# Patient Record
Sex: Male | Born: 1975 | Hispanic: No | Marital: Single | Smoking: Never smoker
Health system: Southern US, Community
[De-identification: ages and names within clinical notes are randomized; demographics above are authoritative.]

## PROBLEM LIST (undated history)

## (undated) ENCOUNTER — Emergency Department (HOSPITAL_COMMUNITY): Admission: EM | Payer: Self-pay | Source: Home / Self Care

---

## 2018-07-23 ENCOUNTER — Encounter (HOSPITAL_COMMUNITY): Payer: Self-pay | Admitting: Emergency Medicine

## 2018-07-23 ENCOUNTER — Ambulatory Visit (HOSPITAL_COMMUNITY)
Admission: EM | Admit: 2018-07-23 | Discharge: 2018-07-23 | Disposition: A | Discharge status: Home / Self Care | Payer: Self-pay | Attending: Family Medicine | Admitting: Family Medicine

## 2018-07-23 DIAGNOSIS — S39012A Strain of muscle, fascia and tendon of lower back, initial encounter: Secondary | ICD-10-CM

## 2018-07-23 MED ORDER — TIZANIDINE HCL 4 MG PO TABS: 4.0000 mg | ORAL_TABLET | Freq: Four times a day (QID) | ORAL | 0 refills | Status: AC | PRN

## 2018-07-23 NOTE — Discharge Instructions (Addendum)
You may take ibuprofen as needed for pain Take the tizanidine as needed muscle relaxer Ice or heat to back  Avoid heavy lifting and twisting for a couple of days  Chiropractor: [facility] [phone]
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
## 2019-01-18 ENCOUNTER — Emergency Department (HOSPITAL_COMMUNITY)
Admission: EM | Admit: 2019-01-18 | Discharge: 2019-01-18 | Disposition: A | Discharge status: Home / Self Care | Payer: Self-pay | Attending: Emergency Medicine | Admitting: Emergency Medicine

## 2019-01-18 ENCOUNTER — Other Ambulatory Visit: Payer: Self-pay

## 2019-01-18 ENCOUNTER — Emergency Department (HOSPITAL_COMMUNITY): Payer: Self-pay

## 2019-01-18 ENCOUNTER — Encounter (HOSPITAL_COMMUNITY): Payer: Self-pay

## 2019-01-18 DIAGNOSIS — S90212A Contusion of left great toe with damage to nail, initial encounter: Secondary | ICD-10-CM | POA: Insufficient documentation

## 2019-01-18 DIAGNOSIS — S92422A Displaced fracture of distal phalanx of left great toe, initial encounter for closed fracture: Secondary | ICD-10-CM | POA: Insufficient documentation

## 2019-01-18 DIAGNOSIS — Y939 Activity, unspecified: Secondary | ICD-10-CM | POA: Insufficient documentation

## 2019-01-18 DIAGNOSIS — S90422A Blister (nonthermal), left great toe, initial encounter: Secondary | ICD-10-CM | POA: Insufficient documentation

## 2019-01-18 DIAGNOSIS — W208XXA Other cause of strike by thrown, projected or falling object, initial encounter: Secondary | ICD-10-CM | POA: Insufficient documentation

## 2019-01-18 DIAGNOSIS — Y929 Unspecified place or not applicable: Secondary | ICD-10-CM | POA: Insufficient documentation

## 2019-01-18 DIAGNOSIS — Y999 Unspecified external cause status: Secondary | ICD-10-CM | POA: Insufficient documentation

## 2019-01-18 MED ORDER — ACETAMINOPHEN 500 MG PO TABS
1000.0000 mg | ORAL_TABLET | Freq: Once | ORAL | Status: DC
  Filled 2019-01-18: qty 2

## 2019-01-18 MED ORDER — AMOXICILLIN-POT CLAVULANATE 875-125 MG PO TABS: 1.0000 | ORAL_TABLET | Freq: Two times a day (BID) | ORAL | 0 refills | Status: AC

## 2019-01-18 MED ORDER — AMOXICILLIN-POT CLAVULANATE 875-125 MG PO TABS
1.0000 | ORAL_TABLET | Freq: Once | ORAL | Status: DC
  Filled 2019-01-18: qty 1

## 2019-01-18 MED ORDER — TETANUS-DIPHTH-ACELL PERTUSSIS 5-2.5-18.5 LF-MCG/0.5 IM SUSP
0.5000 mL | Freq: Once | INTRAMUSCULAR | Status: DC
  Filled 2019-01-18: qty 0.5

## 2019-01-18 NOTE — Discharge Instructions (Addendum)
Please see the information and instructions below regarding your visit.  Your diagnoses today include:  1. Closed fracture of distal phalanx of left great toe, physeal involvement unspecified, initial encounter    You have a fracture of the bone at the end of the toe.  It is beneath the toenail.  Tests performed today include: See side panel of your discharge paperwork for testing performed today. Vital signs are listed at the bottom of these instructions.   Medications prescribed:    Take any prescribed medications only as prescribed, and any over the counter medications only as directed on the packaging.  You may takeTylenol, 650 mg every 6 hours, so you are receiving something for pain every 3 hours.  This is not a long-term medication unless under the care and direction of your primary provider. Taking this medication long-term and not under the supervision of a healthcare provider could increase the risk of stomach ulcers, kidney problems, and cardiovascular problems such as high blood pressure.   You can alternate with ibuprofen, 600 mg every 6-8 hours as needed for pain.  Home care instructions:  Please follow any educational materials contained in this packet.   Please ice the area 3-4 times a day, 10 minutes on 10 minutes off repeated 3 times.  Use a barrier in between your toe and the ice to prevent any superficial nerve damage.  Please perform warm water soaks mixed wit hydrogen peroxide in a 50/50 mixture.  This will help dissolve some of the dried blood from underneath the toenail.  Ideally you should use the postop shoe that I gave you for protection and support of the toe for the next 6 weeks or until discussed with Dr. Lucia Gaskins.  The risks of discontinuing this prematurely are ongoing pain and development of osteoarthritis.  Follow-up instructions: Please follow-up with Dr. Lucia Gaskins of orthopedics as soon as possible.    Return instructions:  Please return to the Emergency  Department if you experience worsening symptoms.  Please come back to the ER if you have any worsening pain, swelling, redness, or drainage that is green or yellow. Please return if you have any other emergent concerns.  Additional Information:   Your vital signs today were: BP 133/88 (BP Location: Right Arm)    Pulse 85    Temp 98 F (36.7 C) (Oral)    Resp 20    SpO2 97%  If your blood pressure (BP) was elevated on multiple readings during this visit above 130 for the top number or above 80 for the bottom number, please have this repeated by your primary care provider within one month. --------------  Thank you for allowing [place] to participate in your care today.

## 2019-01-18 NOTE — Progress Notes (Signed)
Orthopedic Tech Progress Note Patient Details:  Danny Arroyo 17-Oct-1975 [id number]  Ortho Devices Type of Ortho Device: Postop shoe/boot Ortho Device/Splint Location: lle Ortho Device/Splint Interventions: Adjustment, Application, Ordered   Post Interventions Patient Tolerated: Well Instructions Provided: Poper ambulation with device, Care of device, Adjustment of device   Janit Pagan 01/18/2019, 9:13 AM
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
## 2019-01-18 NOTE — ED Notes (Signed)
ED Provider at bedside. 

## 2019-01-18 NOTE — ED Notes (Signed)
Ortho tech at bedside 

## 2019-01-18 NOTE — ED Provider Notes (Signed)
[facility] DEPART[facility] Provider Note   CSN: [id number] Arrival date & time: 01/18/19  0700     History   Chief Complaint Chief Complaint  Patient presents with  . Foot Injury    HPI [facility] Arroyo is a 43 y.o. male.     HPI   Patient is a 43 year old male with no significant past medical history presenting for left great toe injury.  He reports that approximately a 10 pound Arroyo fell directly onto his distal left great toe yesterday morning.  He reports that he was able to walk on it.  He does reports immediate swelling and then progressive bruising as well as a "blood blister" developing along the proximal phalanx.  Denies any loss of sensation.  He reports he is able to flex and extend the toe without difficulty.  Last Tdap 2 years ago.  History reviewed. No pertinent past medical history.  There are no active problems to display for this patient.   History reviewed. No pertinent surgical history.      Home Medications    Prior to Admission medications   Medication Sig Start Date End Date Taking? Authorizing Provider  tiZANidine (ZANAFLEX) 4 MG tablet Take 1-2 tablets (4-8 mg total) by mouth every 6 (six) hours as needed for muscle spasms. 07/23/18   Raylene Everts, MD    Family History Family History  Problem Relation Age of Onset  . Healthy Mother   . Healthy Father     Social History Social History   Tobacco Use  . Smoking status: Never Smoker  Substance Use Topics  . Alcohol use: Yes    Frequency: Never  . Drug use: Not on file     Allergies   Patient has no known allergies.   Review of Systems Review of Systems  Musculoskeletal: Positive for arthralgias and joint swelling.  Skin: Positive for color change and wound.  Neurological: Negative for weakness and numbness.     Physical Exam Updated Vital Signs BP 133/88 (BP Location: Right Arm)   Pulse 85   Temp 98 F (36.7 C) (Oral)   Resp 20   SpO2 97%    Physical Exam Vitals signs and nursing note reviewed.  Constitutional:      General: He is not in acute distress.    Appearance: He is well-developed. He is not diaphoretic.     Comments: Sitting comfortably in bed.  HENT:     Head: Normocephalic and atraumatic.  Eyes:     General:        Right eye: No discharge.        Left eye: No discharge.     Conjunctiva/sclera: Conjunctivae normal.     Comments: EOMs normal to gross examination.  Neck:     Musculoskeletal: Normal range of motion.  Cardiovascular:     Rate and Rhythm: Normal rate and regular rhythm.     Comments: Intact, 2+ left DP pulse. Pulmonary:     Comments: Converses comfortably.  No audible wheeze or stridor. Abdominal:     General: There is no distension.  Musculoskeletal:        General: Swelling and tenderness present.     Comments: Left great toe exam: See clinical photo for details.  Patient has swollen and ecchymotic left great toe.  He has a moderate sized subungual hematoma that is congealed.  He has a [place] apparently filled with blood along the PIP joint.  Patient is able to dorsiflex and plantar  flex the toe without difficulty.  No joint instability.  Sensation intact distally.  Skin:    General: Skin is warm and dry.  Neurological:     Mental Status: He is alert.     Comments: Cranial nerves intact to gross observation. Patient moves extremities without difficulty.  Psychiatric:        Behavior: Behavior normal.        Thought Content: Thought content normal.        Judgment: Judgment normal.         ED Treatments / Results  Labs (all labs ordered are listed, but only abnormal results are displayed) Labs Reviewed - No data to display  EKG None  Radiology Dg Foot Complete Left  Result Date: 01/18/2019 CLINICAL DATA:  Patient with crush injury to the foot. EXAM: LEFT FOOT - COMPLETE 3+ VIEW COMPARISON:  None. FINDINGS: Normal anatomic alignment. Plantar calcaneal spurring. Midfoot  degenerative changes. There is a comminuted fracture of the distal phalanx of the great toe with overlying soft tissue swelling. MTP joint degenerative changes. No evidence for associated acute fractures. IMPRESSION: Comminuted fracture of the distal phalanx of the great toe. Electronically Signed   By: Annia Beltrew  Davis M.D.   On: 01/18/2019 08:20    Procedures Procedures (including critical care time)  Medications Ordered in ED Medications - No data to display   Initial Impression / Assessment and Plan / ED Course  I have reviewed the triage vital signs and the nursing notes.  Pertinent labs & imaging results that were available during my care of the patient were reviewed by me and considered in my medical decision making (see chart for details).  Clinical Course as of Jan 18 828  Sat Jan 18, 2019  0825 Pt reports he will take post op shoe but does not want crutches.    [AM]    Clinical Course User Index [AM] Elisha PonderMurray, Trino Higinbotham B, PA-C        This is a well-appearing 43 year old male with no significant past medical history presenting for injury to the left great toe.  Neurovascularly intact.  Patient is not a diabetic.  Given the large subungual hematoma, concern exists for nailbed injury.  Radiograph demonstrating comminuted distal phalanx fracture of the left great toe. Patient has no diabetes or PVD history.  Subungual hematoma at this point in healing was not amenable to trephination.  I did discuss with the patient the risk that there is a nailbed laceration overlying the fracture.  Discussed risks and benefit of exploring the nailbed after removing the toenail.  Patient declined this procedure.  I discussed with the patient that this could technically be an open fracture and should be treated with antibiotics prophylactically.  Blood-filled bullae was drained with 18-gauge needle for comfort.  Toe was buddy taped, patient was placed in a postop shoe.  He was encouraged to use crutches  however declined this.  He is instructed to follow-up with orthopedics as soon as possible for ongoing management.  Recommended Tylenol as needed for pain.  Patient prescribed antibiotic prophylaxis.  Patient reports Tdap 2 years ago. Return precautions given for any increasing pain, swelling, erythema or drainage suggestive of underlying infection.  Patient is in understanding and agrees with the plan of care.  9:20 AM I was informed by RN that the patient had left prior to receiving paperwork.  I was able to reach him by phone and give him the number for orthopedics to follow-up with as well as  reemphasized that he needs to go get his antibiotics at the pharmacy.  Patient is in understanding.  This is a supervised visit with Dr. Eber HongBrian Miller. Evaluation, management, and discharge planning discussed with this attending physician.  Final Clinical Impressions(s) / ED Diagnoses   Final diagnoses:  Closed fracture of distal phalanx of left great toe, physeal involvement unspecified, initial encounter    ED Discharge Orders         Ordered    amoxicillin-clavulanate (AUGMENTIN) 875-125 MG tablet  Every 12 hours     01/18/19 0848           Elisha PonderMurray, Vannak Montenegro B, PA-C 01/18/19 0920    Eber HongMiller, Brian, MD 01/21/19 2049

## 2019-01-18 NOTE — ED Notes (Signed)
Patient transported to X-ray 

## 2019-04-22 ENCOUNTER — Other Ambulatory Visit: Payer: Self-pay | Admitting: Chiropractic Medicine

## 2019-04-22 DIAGNOSIS — M542 Cervicalgia: Secondary | ICD-10-CM

## 2019-04-22 DIAGNOSIS — M5410 Radiculopathy, site unspecified: Secondary | ICD-10-CM

## 2019-04-23 ENCOUNTER — Other Ambulatory Visit: Payer: Self-pay | Admitting: Chiropractic Medicine

## 2019-04-23 DIAGNOSIS — M5410 Radiculopathy, site unspecified: Secondary | ICD-10-CM

## 2019-05-23 ENCOUNTER — Other Ambulatory Visit: Payer: Self-pay

## 2019-11-26 ENCOUNTER — Emergency Department (HOSPITAL_COMMUNITY)
Admission: EM | Admit: 2019-11-26 | Discharge: 2019-11-26 | Disposition: A | Discharge status: Home / Self Care | Payer: Self-pay | Attending: Emergency Medicine | Admitting: Emergency Medicine

## 2019-11-26 DIAGNOSIS — Z5321 Procedure and treatment not carried out due to patient leaving prior to being seen by health care provider: Secondary | ICD-10-CM | POA: Insufficient documentation

## 2019-11-26 DIAGNOSIS — R519 Headache, unspecified: Secondary | ICD-10-CM | POA: Insufficient documentation

## 2019-11-26 DIAGNOSIS — R200 Anesthesia of skin: Secondary | ICD-10-CM | POA: Insufficient documentation

## 2019-11-26 NOTE — ED Notes (Signed)
Pt stated he is going to wait to be seen another time and left

## 2019-11-26 NOTE — ED Triage Notes (Signed)
Pt here with c/o some slight swelling to the left side of the face and some pain to the right temple ,

## 2021-11-09 ENCOUNTER — Emergency Department (HOSPITAL_COMMUNITY): Payer: Self-pay

## 2021-11-09 ENCOUNTER — Encounter (HOSPITAL_COMMUNITY): Payer: Self-pay | Admitting: Emergency Medicine

## 2021-11-09 ENCOUNTER — Emergency Department (HOSPITAL_COMMUNITY)
Admission: EM | Admit: 2021-11-09 | Discharge: 2021-11-09 | Disposition: A | Discharge status: Home / Self Care | Payer: Self-pay | Attending: Emergency Medicine | Admitting: Emergency Medicine

## 2021-11-09 ENCOUNTER — Other Ambulatory Visit: Payer: Self-pay

## 2021-11-09 DIAGNOSIS — E860 Dehydration: Secondary | ICD-10-CM

## 2021-11-09 DIAGNOSIS — E878 Other disorders of electrolyte and fluid balance, not elsewhere classified: Secondary | ICD-10-CM | POA: Insufficient documentation

## 2021-11-09 DIAGNOSIS — R0789 Other chest pain: Secondary | ICD-10-CM | POA: Insufficient documentation

## 2021-11-09 DIAGNOSIS — R61 Generalized hyperhidrosis: Secondary | ICD-10-CM | POA: Insufficient documentation

## 2021-11-09 DIAGNOSIS — R7309 Other abnormal glucose: Secondary | ICD-10-CM | POA: Insufficient documentation

## 2021-11-09 LAB — CBC WITH DIFFERENTIAL/PLATELET
Abs Immature Granulocytes: 0.03 10*3/uL (ref 0.00–0.07)
Basophils Absolute: 0 10*3/uL (ref 0.0–0.1)
Basophils Relative: 1 %
Eosinophils Absolute: 0 10*3/uL (ref 0.0–0.5)
Eosinophils Relative: 0 %
HCT: 35.9 % — ABNORMAL LOW (ref 39.0–52.0)
Hemoglobin: 11 g/dL — ABNORMAL LOW (ref 13.0–17.0)
Immature Granulocytes: 0 %
Lymphocytes Relative: 9 %
Lymphs Abs: 0.8 10*3/uL (ref 0.7–4.0)
MCH: 24 pg — ABNORMAL LOW (ref 26.0–34.0)
MCHC: 30.6 g/dL (ref 30.0–36.0)
MCV: 78.2 fL — ABNORMAL LOW (ref 80.0–100.0)
Monocytes Absolute: 0.9 10*3/uL (ref 0.1–1.0)
Monocytes Relative: 11 %
Neutro Abs: 6.9 10*3/uL (ref 1.7–7.7)
Neutrophils Relative %: 79 %
Platelets: 302 10*3/uL (ref 150–400)
RBC: 4.59 MIL/uL (ref 4.22–5.81)
RDW: 15.9 % — ABNORMAL HIGH (ref 11.5–15.5)
WBC: 8.7 10*3/uL (ref 4.0–10.5)
nRBC: 0 % (ref 0.0–0.2)

## 2021-11-09 LAB — MAGNESIUM: Magnesium: 1.8 mg/dL (ref 1.7–2.4)

## 2021-11-09 LAB — BASIC METABOLIC PANEL
Anion gap: 14 (ref 5–15)
BUN: 8 mg/dL (ref 6–20)
CO2: 24 mmol/L (ref 22–32)
Calcium: 9.2 mg/dL (ref 8.9–10.3)
Chloride: 97 mmol/L — ABNORMAL LOW (ref 98–111)
Creatinine, Ser: 0.93 mg/dL (ref 0.61–1.24)
GFR, Estimated: >60 mL/min (ref >60)
Glucose, Bld: 114 mg/dL — ABNORMAL HIGH (ref 70–99)
Potassium: 3.6 mmol/L (ref 3.5–5.1)
Sodium: 135 mmol/L (ref 135–145)

## 2021-11-09 LAB — TROPONIN I (HIGH SENSITIVITY): Troponin I (High Sensitivity): 8 ng/L (ref <18)

## 2021-11-09 MED ORDER — SODIUM CHLORIDE 0.9 % IV BOLUS
1000.0000 mL | Freq: Once | INTRAVENOUS | Status: AC
  Administered 2021-11-09: 1000 mL via INTRAVENOUS

## 2021-11-09 NOTE — ED Provider Notes (Signed)
[facility] EMERGENCY DEPARTMENT Provider Note   CSN: [id number] Arrival date & time: 11/09/21  1854     History  Chief Complaint  Patient presents with   Chest Pain   Nausea    [facility] Arroyo is a 46 y.o. male.  He has no significant past medical history.  He said he drank a few beers last night when he woke up this morning he was nauseous and had a couple episodes of vomiting.  He was working over [employer] this afternoon feeling nauseous and then he started having some tightness in his left chest.  This lasted for about a minute improved as he was trying to stretch his arm and stretch his chest out.  But he was worried because it was on the left side and it might be a heart attack.  No symptoms currently.  No prior history of cardiac disease.  Denies any cocaine.  The history is provided by the patient.  Chest Pain Pain location:  L chest Pain quality: tightness   Pain radiates to:  Does not radiate Pain severity:  Moderate Onset quality:  Sudden Duration:  1 minute Timing:  Constant Progression:  Resolved Chronicity:  New Relieved by: Stretching. Worsened by:  Nothing Ineffective treatments:  None tried Associated symptoms: diaphoresis, nausea and vomiting   Associated symptoms: no abdominal pain, no fever, no headache and no shortness of breath       Home Medications Prior to Admission medications   Medication Sig Start Date End Date Taking? Authorizing Provider  tiZANidine (ZANAFLEX) 4 MG tablet Take 1-2 tablets (4-8 mg total) by mouth every 6 (six) hours as needed for muscle spasms. 07/23/18   Raylene Everts, MD      Allergies    Patient has no known allergies.    Review of Systems   Review of Systems  Constitutional:  Positive for diaphoresis. Negative for fever.  HENT:  Negative for sore throat.   Eyes:  Negative for visual disturbance.  Respiratory:  Negative for shortness of breath.   Cardiovascular:  Positive for chest pain.   Gastrointestinal:  Positive for nausea and vomiting. Negative for abdominal pain.  Genitourinary:  Negative for dysuria.  Musculoskeletal:  Negative for neck pain.  Skin:  Negative for rash.  Neurological:  Negative for headaches.   Physical Exam Updated Vital Signs BP (!) 122/100 (BP Location: Right Arm)   Pulse (!) 110   Temp 98.1 F (36.7 C) (Oral)   Resp 16   Ht 5\' 10"  (1.778 m)   Wt 83.9 kg   SpO2 99%   BMI 26.54 kg/m  Physical Exam Vitals and nursing note reviewed.  Constitutional:      General: He is not in acute distress.    Appearance: He is well-developed.  HENT:     Head: Normocephalic and atraumatic.  Eyes:     Conjunctiva/sclera: Conjunctivae normal.  Cardiovascular:     Rate and Rhythm: Normal rate and regular rhythm.     Heart sounds: No murmur heard. Pulmonary:     Effort: Pulmonary effort is normal. No respiratory distress.     Breath sounds: Normal breath sounds.  Abdominal:     Palpations: Abdomen is soft.     Tenderness: There is no abdominal tenderness.  Musculoskeletal:        General: No swelling. Normal range of motion.     Cervical back: Neck supple.     Right lower leg: No tenderness.  Left lower leg: No tenderness.  Skin:    General: Skin is warm and dry.     Capillary Refill: Capillary refill takes less than 2 seconds.  Neurological:     General: No focal deficit present.     Mental Status: He is alert.    ED Results / Procedures / Treatments   Labs (all labs ordered are listed, but only abnormal results are displayed) Labs Reviewed  BASIC METABOLIC PANEL - Abnormal; Notable for the following components:      Result Value   Chloride 97 (*)    Glucose, Bld 114 (*)    All other components within normal limits  CBC WITH DIFFERENTIAL/PLATELET - Abnormal; Notable for the following components:   Hemoglobin 11.0 (*)    HCT 35.9 (*)    MCV 78.2 (*)    MCH 24.0 (*)    RDW 15.9 (*)    All other components within normal limits   MAGNESIUM  TROPONIN I (HIGH SENSITIVITY)  TROPONIN I (HIGH SENSITIVITY)    EKG EKG Interpretation  Date/Time:  Wednesday Nov 09 2021 19:07:30 EDT Ventricular Rate:  104 PR Interval:  134 QRS Duration: 96 QT Interval:  356 QTC Calculation: 469 R Axis:   104 Text Interpretation: Sinus tachycardia Right axis deviation No old tracing to compare Confirmed by Meridee ScoreButler, Monigue Spraggins [phone]([id number]) on 11/09/2021 7:14:22 PM  Radiology DG Chest Port 1 View  Result Date: 11/09/2021 CLINICAL DATA:  Chest pain EXAM: PORTABLE CHEST 1 VIEW COMPARISON:  None Available. FINDINGS: Lungs are clear.  No pleural effusion or pneumothorax. The heart is normal in size. IMPRESSION: No evidence of acute cardiopulmonary disease. Electronically Signed   By: Charline BillsSriyesh  Krishnan M.D.   On: 11/09/2021 20:01    Procedures Procedures    Medications Ordered in ED Medications  sodium chloride 0.9 % bolus 1,000 mL (has no administration in time range)    ED Course/ Medical Decision Making/ A&P Clinical Course as of 11/09/21 2316  Wed Nov 09, 2021  2127 Patient unable to stay for lab work to result.  He needs to catch last bus.  He understands that this work-up is incomplete.  I will attempt to follow-up on his lab work and he is agreeable to return if there is any significant abnormalities. [MB]    Clinical Course User Index [MB] Terrilee FilesButler, Tahlor Berenguer C, MD                           Medical Decision Making Amount and/or Complexity of Data Reviewed Labs: ordered. Radiology: ordered.  This patient complains of dehydration chest pain; this involves an extensive number of treatment Options and is a complaint that carries with it a high risk of complications and morbidity. The differential includes dehydration, musculoskeletal, spasms, PE, pneumothorax, ACS  I ordered, reviewed and interpreted labs, which included CBC with normal white count, hemoglobin low unclear priors, chemistries with mildly low chloride elevated glucose,  troponin unremarkable I ordered medication 1 L IV fluids with improvement in his symptoms and reviewed PMP when indicated. I ordered imaging studies which included chest x-ray and I independently    visualized and interpreted imaging which showed no acute findings Previous records obtained and reviewed in epic no recent admissions  Cardiac monitoring reviewed, normal sinus rhythm Social determinants considered, no significant barriers Critical Interventions: None  After the interventions stated above, I reevaluated the patient and found patient symptoms to be improved Admission and further testing considered, he  unfortunately needed to leave due to transportation issues.  Recommended return to the emergency department if any continued symptoms.          Final Clinical Impression(s) / ED Diagnoses Final diagnoses:  Atypical chest pain  Dehydration    Rx / DC Orders ED Discharge Orders     None         Hayden Rasmussen, MD 11/09/21 2318

## 2021-11-09 NOTE — Discharge Instructions (Signed)
You are seen in the emergency department for an episode of chest pain and feeling dehydrated.  Your lab work had not fully resulted but you needed to leave to catch last pass.  I will contact you if there are any significant abnormalities in your labs that require returning to the emergency department.  Please tell well-hydrated.

## 2021-11-09 NOTE — ED Notes (Signed)
All discharge instructions reviewed with patient and patient verbalized understanding of same. Patient stable and ambulatory at time of discharge.  ?

## 2021-11-09 NOTE — ED Triage Notes (Signed)
Patient here POV states he is feeling dehydrated. Patient reports drinking a few beers the day before and started feeling this way the today. Pt was at work, and an hour ago felt he was over heated, nauseated, felt chest tightening, He is currently not feeling the chest tightening, but felt as though he was having a heart attack. Pt Aox4.

## 2023-09-06 IMAGING — DX DG CHEST 1V PORT
1 series · 1 of 1 positions shown · non-contrast
Comparison: None Available.

CLINICAL DATA: Chest pain

EXAM:
PORTABLE CHEST 1 VIEW

[chest ap]
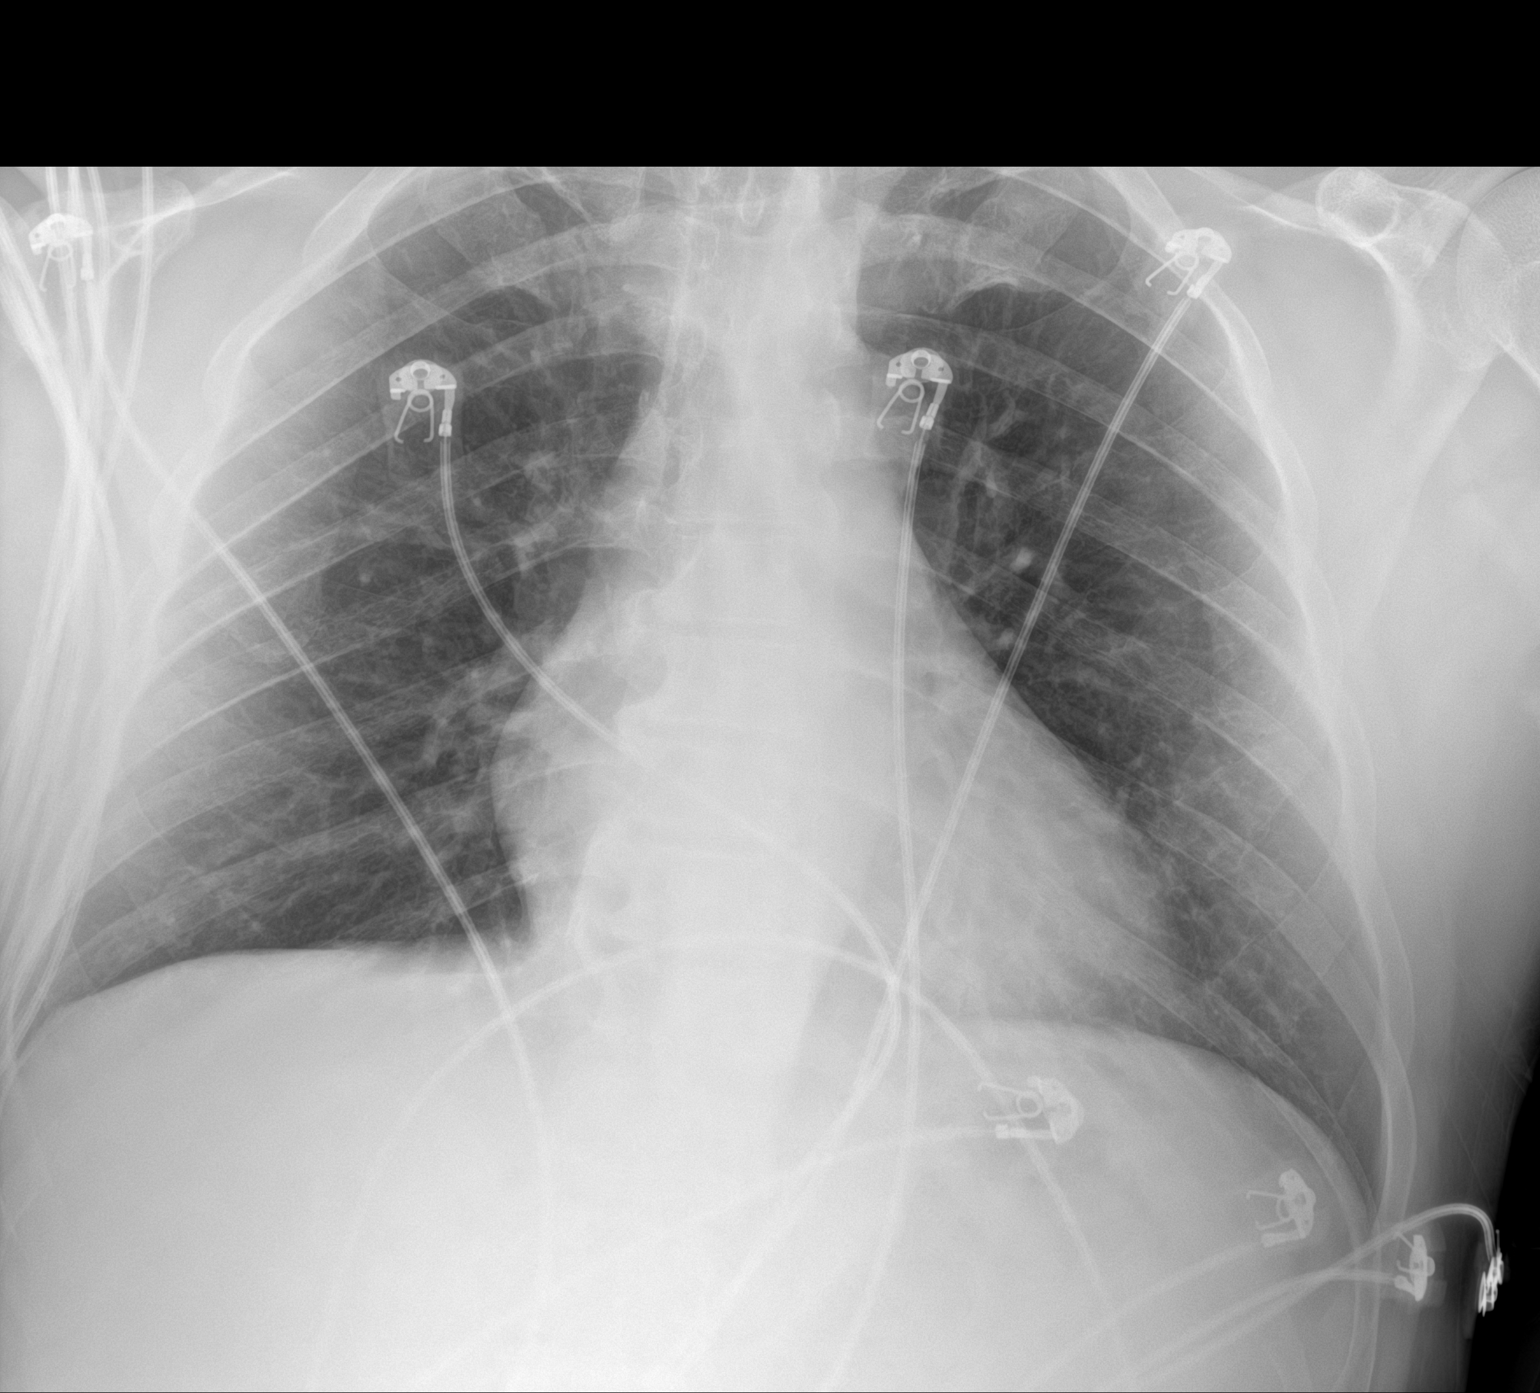

[1 of 1 positions shown; findings below may reference images not displayed]

FINDINGS: Lungs are clear.  No pleural effusion or pneumothorax.

The heart is normal in size.
IMPRESSION: No evidence of acute cardiopulmonary disease.
